# Patient Record
Sex: Female | Born: 1980 | Smoking: Never smoker
Health system: Southern US, Community
[De-identification: ages and names within clinical notes are randomized; demographics above are authoritative.]

## PROBLEM LIST (undated history)

## (undated) DIAGNOSIS — O24419 Gestational diabetes mellitus in pregnancy, unspecified control: Secondary | ICD-10-CM

## (undated) HISTORY — PX: DILATION AND CURETTAGE OF UTERUS: SHX78

---

## 2019-09-03 ENCOUNTER — Other Ambulatory Visit (HOSPITAL_COMMUNITY): Payer: Self-pay | Admitting: Obstetrics and Gynecology

## 2019-09-03 DIAGNOSIS — Z3689 Encounter for other specified antenatal screening: Secondary | ICD-10-CM

## 2019-09-03 DIAGNOSIS — Z3A20 20 weeks gestation of pregnancy: Secondary | ICD-10-CM

## 2019-09-03 DIAGNOSIS — O09522 Supervision of elderly multigravida, second trimester: Secondary | ICD-10-CM

## 2019-09-07 ENCOUNTER — Encounter (HOSPITAL_COMMUNITY): Payer: Self-pay | Admitting: *Deleted

## 2019-09-08 ENCOUNTER — Ambulatory Visit (HOSPITAL_COMMUNITY): Payer: BC Managed Care – PPO | Admitting: *Deleted

## 2019-09-08 ENCOUNTER — Ambulatory Visit (HOSPITAL_COMMUNITY)
Admission: RE | Admit: 2019-09-08 | Discharge: 2019-09-08 | Disposition: A | Discharge status: Home / Self Care | Payer: BC Managed Care – PPO | Source: Ambulatory Visit | Attending: Obstetrics and Gynecology | Admitting: Obstetrics and Gynecology

## 2019-09-08 ENCOUNTER — Encounter (HOSPITAL_COMMUNITY): Payer: Self-pay

## 2019-09-08 ENCOUNTER — Other Ambulatory Visit: Payer: Self-pay

## 2019-09-08 ENCOUNTER — Ambulatory Visit (HOSPITAL_BASED_OUTPATIENT_CLINIC_OR_DEPARTMENT_OTHER): Payer: BC Managed Care – PPO | Admitting: Genetic Counselor

## 2019-09-08 ENCOUNTER — Other Ambulatory Visit (HOSPITAL_COMMUNITY): Payer: Self-pay | Admitting: *Deleted

## 2019-09-08 VITALS — BP 108/66 | HR 85 | Temp 97.5°F | Ht 62.0 in | Wt 109.8 lb

## 2019-09-08 DIAGNOSIS — O09522 Supervision of elderly multigravida, second trimester: Secondary | ICD-10-CM | POA: Insufficient documentation

## 2019-09-08 DIAGNOSIS — O280 Abnormal hematological finding on antenatal screening of mother: Secondary | ICD-10-CM | POA: Insufficient documentation

## 2019-09-08 DIAGNOSIS — Z315 Encounter for genetic counseling: Secondary | ICD-10-CM | POA: Diagnosis not present

## 2019-09-08 DIAGNOSIS — O09292 Supervision of pregnancy with other poor reproductive or obstetric history, second trimester: Secondary | ICD-10-CM

## 2019-09-08 DIAGNOSIS — R772 Abnormality of alphafetoprotein: Secondary | ICD-10-CM

## 2019-09-08 DIAGNOSIS — O289 Unspecified abnormal findings on antenatal screening of mother: Secondary | ICD-10-CM

## 2019-09-08 DIAGNOSIS — O281 Abnormal biochemical finding on antenatal screening of mother: Secondary | ICD-10-CM

## 2019-09-08 DIAGNOSIS — Z3A20 20 weeks gestation of pregnancy: Secondary | ICD-10-CM | POA: Insufficient documentation

## 2019-09-08 DIAGNOSIS — Z3A19 19 weeks gestation of pregnancy: Secondary | ICD-10-CM | POA: Diagnosis not present

## 2019-09-08 DIAGNOSIS — Z3689 Encounter for other specified antenatal screening: Secondary | ICD-10-CM | POA: Diagnosis present

## 2019-09-08 HISTORY — DX: Gestational diabetes mellitus in pregnancy, unspecified control: O24.419

## 2019-09-08 NOTE — Progress Notes (Signed)
09/08/2019  Jamie Leon 1980/12/29 MRN: [id number] DOV: 09/08/2019  Jamie Leon presented to the [facility] for a genetics consultation regarding abnormal MS-AFP screening results. Jamie Leon came to her appointment alone due to COVID-19 visitor restrictions.   Indication for genetic counseling - Increased risk for open neural tube defect onMS-AFPscreening (2.54 & 2.84 MoM)  Prenatal history  Jamie Leon is a [id number], 39 y.o. female. Her current pregnancy has completed [redacted]w[redacted]d (Estimated Date of Delivery: 01/29/20).  Jamie Leon denied exposure to environmental toxins or chemical agents. She denied the use of alcohol, tobacco or street drugs. She reported taking prenatal vitamins and omega-3. She denied significant viral illnesses, fevers, and bleeding during the course of her pregnancy. Her medical and surgical histories were noncontributory.  Family History  A three generation pedigree was drafted and reviewed. The family history is remarkable for the following:  - Jamie Leon's sister has rheumatoid arthritis. We discussed that this is one condition in the family of conditions known as autoimmune conditions. Autoimmune conditions occur when an individual's body launches an abnormal immune response and begins to destroy its own cells. There are several different autoimmune conditions. While we do know that autoimmune conditions tend to "cluster" within a family, they do not follow a clear pattern of inheritance. When there is a person in the family with an autoimmune condition, there is an increased chance for others in the family to develop an autoimmune condition, but it may be a different condition. Specific risk factor information is not available. Genetic testing is not available at this time to predict who may develop an autoimmune condition.  - Jamie Leon has a maternal uncle who has hearing loss. She reported that he was frequently sick as a child. None of his children have hearing  loss. Hearing loss can have many causes including genetic factors, environmental factors, or a combination of both. We discussed that if people have frequent ear infections, they can develop a type of hearing loss called conductive hearing loss. If this is the case for her uncle, the risk for hearing loss in Jamie Leon's children is likely not greatly elevated over general population risk.  The remaining family histories were reviewed and found to be noncontributory for birth defects, intellectual disability, recurrent pregnancy loss, and known genetic conditions.    The patient's ethnicity is [place]. The father of the pregnancy's ethnicity is [place]. Ashkenazi Jewish ancestry and consanguinity were denied. Pedigree will be scanned under Media.  Discussion  Jamie Leon was referred to genetic counseling as an increased risk for a fetal open neural tube defect (ONTD) such as spina bifida was identified on MS-AFP screening. Jamie Leon had samples for MS-AFP screening drawn twice during this pregnancy. Results of the first screen indicated that the current fetus has a 1 in 4 (0.4%) risk to be affected with an ONTD. Results from the second screen indicated a 1 in 155 (0.6%) risk for an ONTD in the current fetus.   We reviewed these MS-AFP results in detail. We discussed that there are many explanations for an elevated AFP result, including twin pregnancies, dating errors, ONTDs such as anencephaly or spina bifida, abdominal wall defects, placental abnormalities, fetal growth restriction, adverse obstetrical outcomes (oligohydramnios, placental abruption, intrauterine fetal demise/stillbirth, preterm delivery, premature rupture of membranes, or preeclampsia), or a normal variant.    Jamie Leon had her anatomy ultrasound performed prior to her genetic counseling appointment. The ultrasound report will be sent under  separate cover. There were no visualized fetal anomalies or markers suggestive of aneuploidy.  Anatomy ultrasound also did not detect twins, ONTDs, abdominal wall defects, placental abnormalities, or oligohydramnios. A dating error for the pregnancy also is not likely to be the cause for the elevated AFP result. In light of a normal anatomy ultrasound, it is possible that an explanation may not be found for Jamie Leon's elevated MS-AFP results. Given these unexplained elevated AFP levels, the pregnancy will be monitored more closely for fetal growth restriction and maternal hypertension.   Per ACOG recommendation, carrier screening for hemoglobinopathies, cystic fibrosis (CF) and spinal muscular atrophy (SMA) was discussed including information about the conditions, rationale for testing, autosomal recessive inheritance, and the option of prenatal diagnosis. Jamie Leon had a negative hemoglobin electrophoresis but has not had carrier screening for CF or SMA. She was offered and declined screening for these conditions today. Without carrier screening to refine risk and based on ethnicity alone, Jamie Leon's risk to be a carrier of CF is 1 in 54 and her risk to be a carrier of SMA is 1 in 65. Jamie Leon was informed that select hemoglobinopathies and CF are included on [place]'s newborn screen, but that SMA currently is not. We discussed the Early Check research study to add SMA to her baby's newborn screening panel. Jamie Leon indicated that she was not interested in receiving more information about this study.  We also reviewed that Jamie Leon reportedly had Panorama NIPS through the laboratory [facility] that was low-risk for fetal aneuploidies. We reviewed that while this testing identifies 94-99% of pregnancies with trisomy 49, trisomy 25, trisomy 35, sex chromosome aneuploidies, and triploidy, it is NOT diagnostic. A positive test result requires confirmation by CVS or amniocentesis, and a negative test result does not rule out a fetal chromosome abnormality. She also understands that this testing does not identify  all genetic conditions.  Jamie Leon was also counseled regarding diagnostic testing via amniocentesis. We discussed the technical aspects of the procedure and quoted up to a 1 in 500 (0.2%) risk for spontaneous pregnancy loss or other adverse pregnancy outcomes as a result of amniocentesis. Cultured cells from an amniocentesis sample allow for the visualization of a fetal karyotype, which can detect >99% of chromosomal aberrations. Chromosomal microarray can also be performed to identify smaller deletions or duplications of fetal chromosomal material. Amniocentesis could also be performed to assess whether the baby is affected by an ONTDvia analysis of AF-AFP and acetylcholinesterase. After careful consideration, Ms. Wages declined amniocentesis at this time. She understands that amniocentesis is available at any point after 16 weeks of pregnancy and that she may opt to undergo the procedure at a later date should she change her mind.  Additional screening and diagnostic testing were declined today. She understands that screening tests, including ultrasound, cannot rule out all birth defects or genetic syndromes. The patient was advised of this limitation and states she still does not want additional testing or screening at this time.   I counseled Ms. Schank regarding the above risks and available options. The approximate face-to-face time with the genetic counselor was 25 minutes.  In summary:  Reviewed MS-AFP results  Elevated MS-AFP (MoM 2.54 & 2.84, 1 in 155 to 1 in 267 risk for open neural tube defects)  Reviewed results of ultrasound  No fetal anomalies or markers seen  Reduction in risk for fetal aneuploidy  Reviewed low-risk NIPS result  Reduction in risk for Down syndrome, trisomy 38, trisomy  18, and sex chromosome aneuploidies  Discussed carrier screening for cystic fibrosis, spinal muscular atrophy, and hemoglobinopathies  Previously had negative hemoglobin electrophoresis  Declined  additional carrier screening  Offered additional testing and screening  Declined amniocentesis  Reviewed family history concerns   Gershon Crane, MS Genetic Counselor

## 2019-09-16 ENCOUNTER — Ambulatory Visit (HOSPITAL_COMMUNITY): Payer: Self-pay

## 2019-09-21 ENCOUNTER — Ambulatory Visit (HOSPITAL_COMMUNITY): Payer: Self-pay | Admitting: Obstetrics and Gynecology

## 2019-10-08 ENCOUNTER — Other Ambulatory Visit (HOSPITAL_COMMUNITY): Payer: Self-pay | Admitting: *Deleted

## 2019-10-08 ENCOUNTER — Other Ambulatory Visit: Payer: Self-pay

## 2019-10-08 ENCOUNTER — Ambulatory Visit (HOSPITAL_COMMUNITY)
Admission: RE | Admit: 2019-10-08 | Discharge: 2019-10-08 | Disposition: A | Discharge status: Home / Self Care | Payer: BC Managed Care – PPO | Source: Ambulatory Visit | Attending: Obstetrics and Gynecology | Admitting: Obstetrics and Gynecology

## 2019-10-08 ENCOUNTER — Encounter (HOSPITAL_COMMUNITY): Payer: Self-pay

## 2019-10-08 ENCOUNTER — Ambulatory Visit (HOSPITAL_COMMUNITY): Payer: BC Managed Care – PPO | Admitting: *Deleted

## 2019-10-08 VITALS — BP 98/62 | HR 90 | Temp 97.3°F

## 2019-10-08 DIAGNOSIS — O280 Abnormal hematological finding on antenatal screening of mother: Secondary | ICD-10-CM | POA: Diagnosis not present

## 2019-10-08 DIAGNOSIS — O09292 Supervision of pregnancy with other poor reproductive or obstetric history, second trimester: Secondary | ICD-10-CM

## 2019-10-08 DIAGNOSIS — R772 Abnormality of alphafetoprotein: Secondary | ICD-10-CM | POA: Diagnosis present

## 2019-10-08 DIAGNOSIS — Z362 Encounter for other antenatal screening follow-up: Secondary | ICD-10-CM

## 2019-10-08 DIAGNOSIS — O09529 Supervision of elderly multigravida, unspecified trimester: Secondary | ICD-10-CM

## 2019-10-08 DIAGNOSIS — Z3A23 23 weeks gestation of pregnancy: Secondary | ICD-10-CM

## 2019-10-08 DIAGNOSIS — O09522 Supervision of elderly multigravida, second trimester: Secondary | ICD-10-CM

## 2019-11-05 ENCOUNTER — Ambulatory Visit (HOSPITAL_COMMUNITY): Payer: BC Managed Care – PPO

## 2019-11-05 ENCOUNTER — Encounter (HOSPITAL_COMMUNITY): Payer: Self-pay

## 2020-01-29 ENCOUNTER — Inpatient Hospital Stay (HOSPITAL_COMMUNITY)
Admission: AD | Admit: 2020-01-29 | Payer: BC Managed Care – PPO | Source: Home / Self Care | Admitting: Obstetrics and Gynecology

## 2021-01-16 IMAGING — US US MFM OB FOLLOW-UP
1 series · 13 of 28 positions shown · non-contrast
Comparison: none

[Series 1: us mfm ob follow-up · 74 acquisitions, 13 frames shown]
[im 3/74]
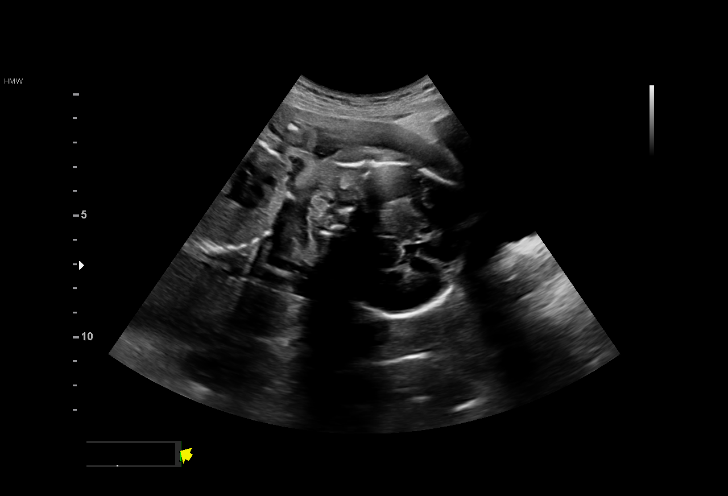
[im 9/74]
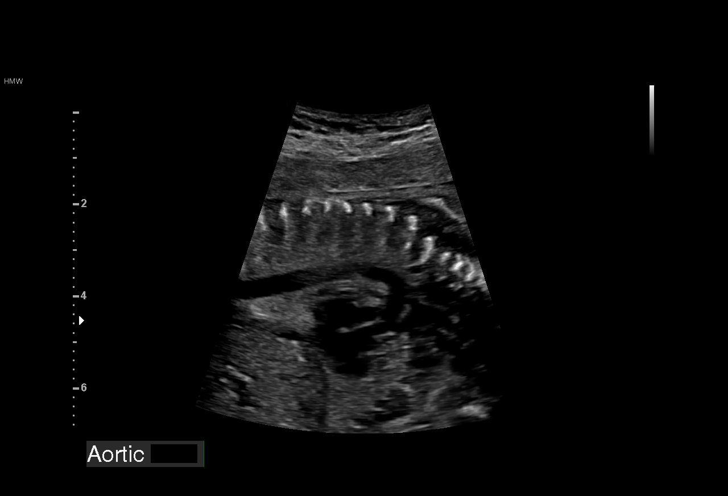
[im 14/74]
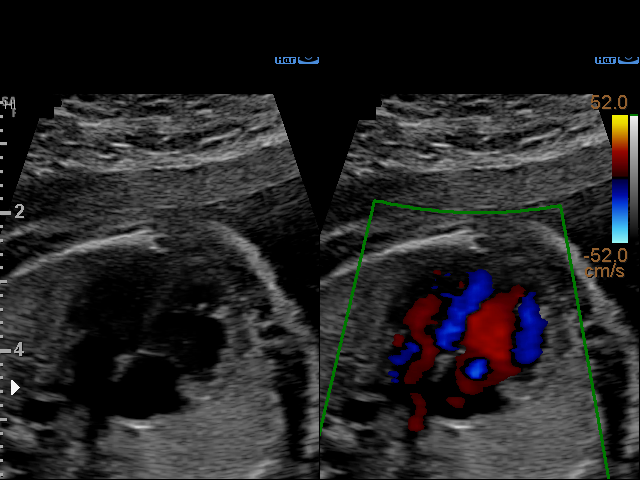
[im 19/74]
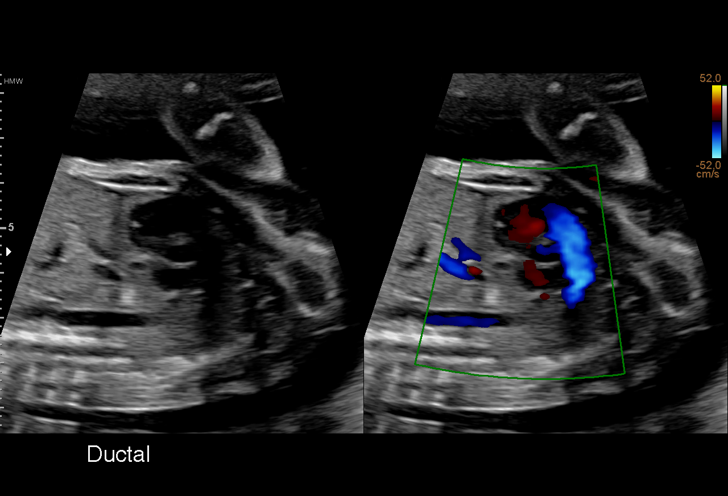
[im 25/74]
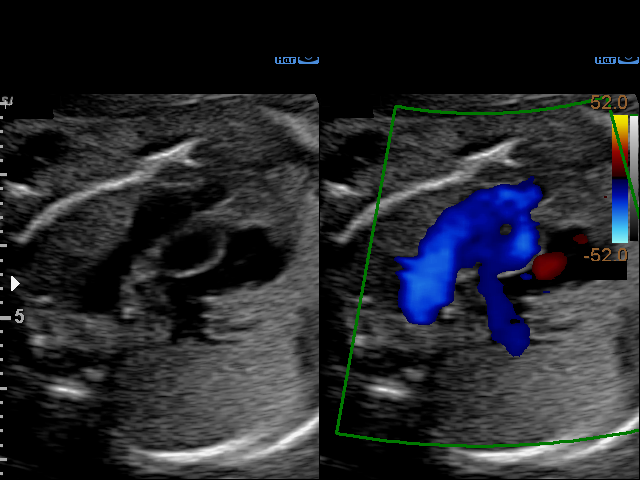
[im 30/74]
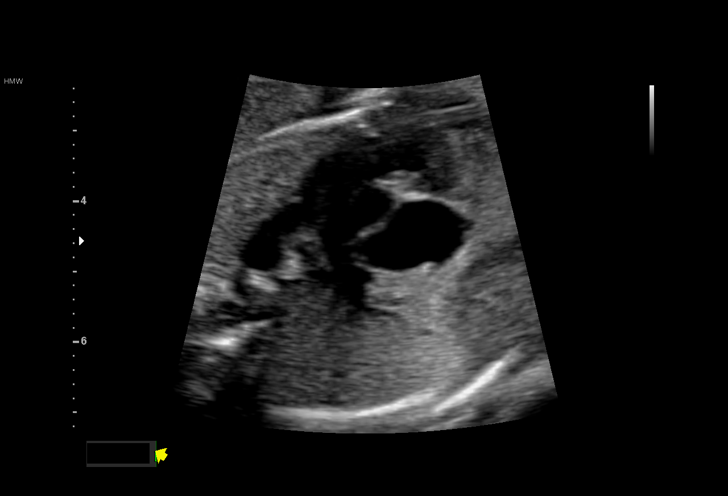
[im 38/74]
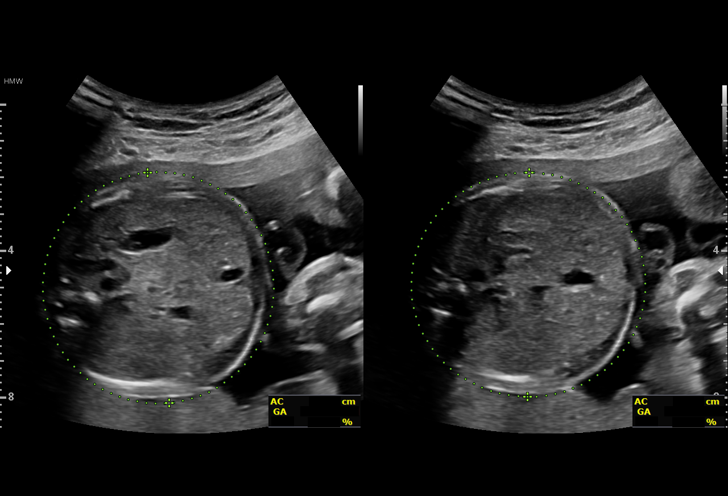
[im 44/74]
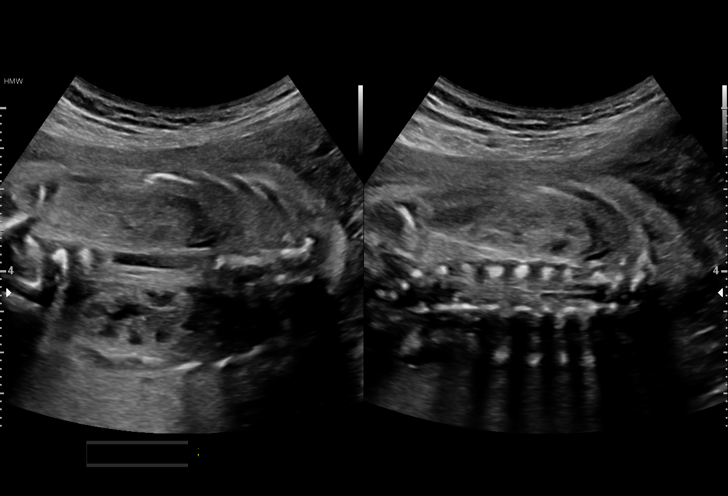
[im 49/74]
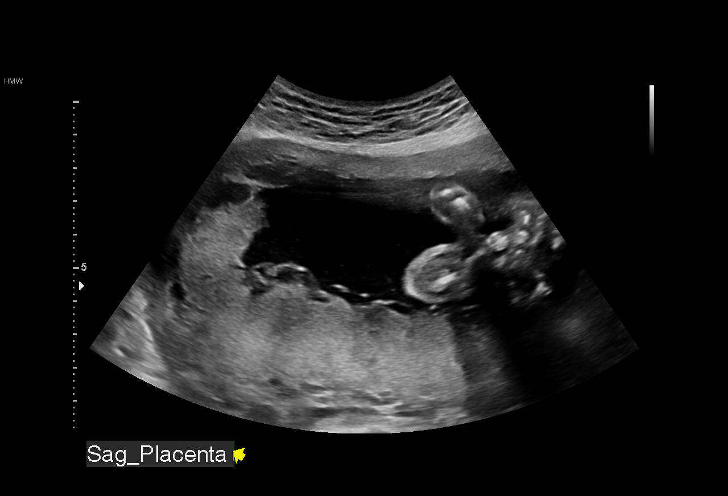
[im 55/74]
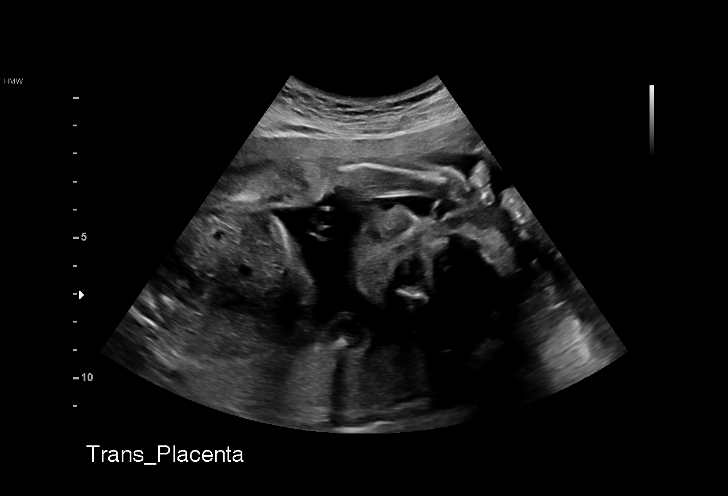
[im 60/74]
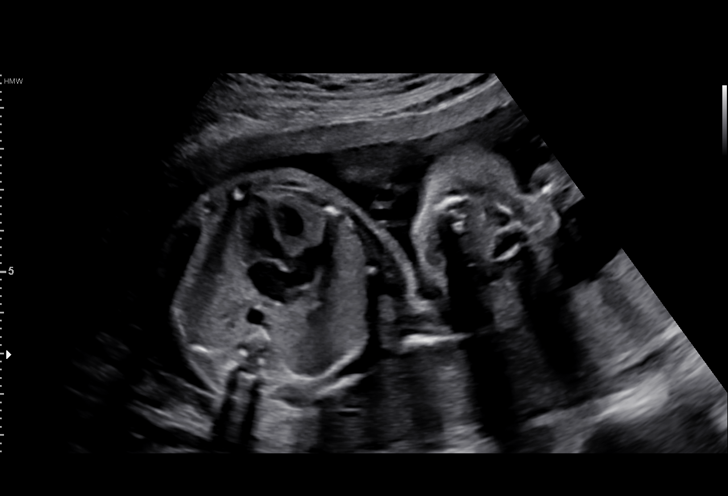
[im 65/74]
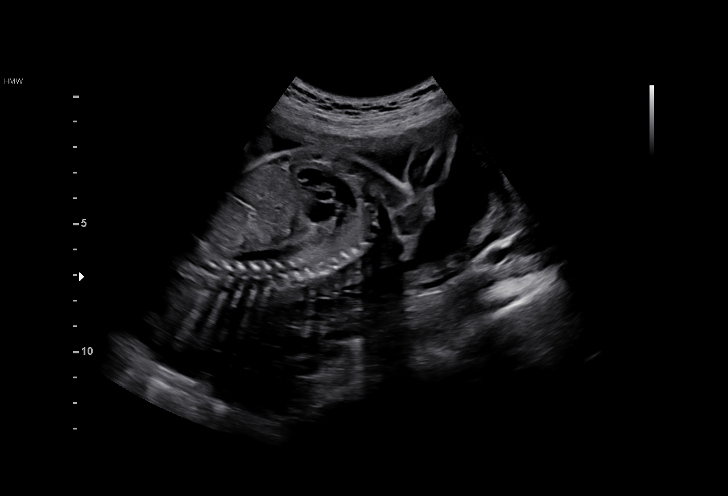
[im 71/74]
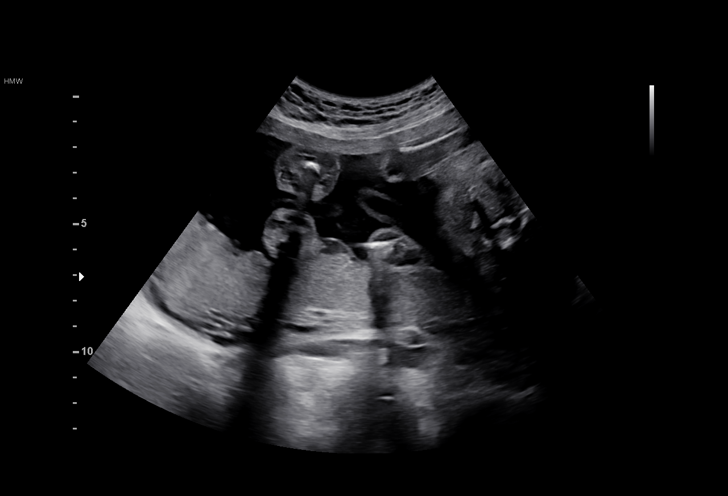

[13 of 28 positions shown; findings below may reference images not displayed]

[id number] [HOSPITAL]

                                                       [place]
 ----------------------------------------------------------------------

 ----------------------------------------------------------------------
Indications

  Advanced maternal age multigravida 35+,
  second trimester
  Abnormal biochemical screen (AFP 2.54,
  OSBR [DATE])
  23 weeks gestation of pregnancy
  Poor obstetric history: Previous gestational
  diabetes
  Low risk NIPS
  Encounter for other antenatal screening
  follow-up
 ----------------------------------------------------------------------
Fetal Evaluation

 Num Of Fetuses:         1
 Fetal Heart Rate(bpm):  154
 Cardiac Activity:       Observed
 Presentation:           Cephalic
 Placenta:               Posterior
 P. Cord Insertion:      Visualized, central

 Amniotic Fluid
 AFI FV:      Within normal limits

                             Largest Pocket(cm)

Biometry

 BPD:      57.3  mm     G. Age:  23w 4d         32  %    CI:        71.89   %    70 - 86
                                                         FL/HC:      19.7   %    18.7 -
 HC:      215.1  mm     G. Age:  23w 4d         22  %    HC/AC:      1.09        1.05 -
 AC:       198   mm     G. Age:  24w 3d         61  %    FL/BPD:     74.0   %    71 - 87
 FL:       42.4  mm     G. Age:  23w 6d         37  %    FL/AC:      21.4   %    20 - 24

 Est. FW:     660  gm      1 lb 7 oz     53  %
OB History

 Gravidity:    5         Term:   1        Prem:   0        SAB:   1
 TOP:          2       Ectopic:  0        Living: 1
Gestational Age

 LMP:           23w 6d        Date:  04/24/19                 EDD:   01/29/20
 U/S Today:     23w 6d                                        EDD:   01/29/20
 Best:          23w 6d     Det. By:  LMP  (04/24/19)          EDD:   01/29/20
Anatomy

 Cranium:               Appears normal         LVOT:                   Appears normal
 Cavum:                 Previously seen        Aortic Arch:            Appears normal
 Ventricles:            Appears normal         Ductal Arch:            Appears normal
 Choroid Plexus:        Previously seen        Diaphragm:              Previously seen
 Cerebellum:            Previously seen        Stomach:                Appears normal, left
                                                                       sided
 Posterior Fossa:       Previously seen        Abdomen:                Previously seen
 Nuchal Fold:           Previously seen        Abdominal Wall:         Previously seen
 Face:                  Appears normal         Cord Vessels:           Previously seen
                        (orbits and profile)
 Lips:                  Appears normal         Kidneys:                Appear normal
 Palate:                Not well visualized    Bladder:                Appears normal
 Thoracic:              Appears normal         Spine:                  Previously seen
 Heart:                 Appears normal         Upper Extremities:      Previously seen
                        (4CH, axis, and
                        situs)
 RVOT:                  Appears normal         Lower Extremities:      Previously seen

 Other:  Heels visualized previously. Rt hand and 5th visualized previously.
         Technically difficult due to fetal position.
Cervix Uterus Adnexa

 Cervix
 Length:            3.1  cm.
 Normal appearance by transabdominal scan.

 Uterus
 No abnormality visualized.

 Left Ovary
 No adnexal mass visualized.

 Right Ovary
 No adnexal mass visualized.

 Cul De Sac
 No free fluid seen.

 Adnexa
 No abnormality visualized.
Comments

 This patient was seen for a follow up exam as the fetal
 cardiac views were unable to be fully visualized during her
 prior exam.  Her pregnancy has also been complicated by an
 elevated MSAFP level of 2.54 and 2.84 MoM.  She denies
 any problems since her last exam.
 She was informed that the fetal growth and amniotic fluid
 level appears appropriate for her gestational age.
 The views of the fetal heart were visualized today.  There
 were no obvious anomalies suspected.  The limitations of
 ultrasound in the detection of all anomalies was discussed.
 Due to advanced maternal age and the elevated MSAFP, the
 patient was offered and declined an amniocentesis today for
 definitive diagnosis of fetal aneuploidy and spina bifida.
 Due to the association of IUGR with an elevated MSAFP, we
 will continue to follow her with serial growth ultrasounds.
 A follow-up exam was scheduled in 4 weeks.
